# Patient Record
Sex: Female | Born: 2000 | Race: White | Hispanic: No | Marital: Single | Smoking: Never smoker
Health system: Southern US, Community
[De-identification: ages and names within clinical notes are randomized; demographics above are authoritative.]

## PROBLEM LIST (undated history)

## (undated) DIAGNOSIS — T7840XA Allergy, unspecified, initial encounter: Secondary | ICD-10-CM

## (undated) DIAGNOSIS — J45909 Unspecified asthma, uncomplicated: Secondary | ICD-10-CM

## (undated) DIAGNOSIS — R51 Headache: Secondary | ICD-10-CM

## (undated) HISTORY — DX: Allergy, unspecified, initial encounter: T78.40XA

## (undated) HISTORY — DX: Unspecified asthma, uncomplicated: J45.909

## (undated) HISTORY — DX: Headache: R51

---
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
## 2018-01-27 ENCOUNTER — Other Ambulatory Visit: Payer: Self-pay

## 2018-01-27 ENCOUNTER — Ambulatory Visit: Payer: BLUE CROSS/BLUE SHIELD | Admitting: Physician Assistant

## 2018-01-27 ENCOUNTER — Encounter: Payer: Self-pay | Admitting: Physician Assistant

## 2018-01-27 VITALS — BP 108/62 | HR 94 | Temp 99.0°F | Resp 18 | Ht 65.07 in | Wt 106.8 lb

## 2018-01-27 DIAGNOSIS — J4530 Mild persistent asthma, uncomplicated: Secondary | ICD-10-CM | POA: Diagnosis not present

## 2018-01-27 DIAGNOSIS — N946 Dysmenorrhea, unspecified: Secondary | ICD-10-CM | POA: Diagnosis not present

## 2018-01-27 DIAGNOSIS — G43009 Migraine without aura, not intractable, without status migrainosus: Secondary | ICD-10-CM | POA: Diagnosis not present

## 2018-01-27 DIAGNOSIS — F411 Generalized anxiety disorder: Secondary | ICD-10-CM | POA: Diagnosis not present

## 2018-01-27 MED ORDER — ESCITALOPRAM OXALATE 10 MG PO TABS: 10.0000 mg | ORAL_TABLET | Freq: Every day | ORAL | 0 refills | Status: DC

## 2018-01-27 MED ORDER — ONDANSETRON 4 MG PO TBDP: ORAL_TABLET | ORAL | 5 refills | Status: AC

## 2018-01-27 MED ORDER — MONTELUKAST SODIUM 10 MG PO TABS: 10.0000 mg | ORAL_TABLET | Freq: Every day | ORAL | 3 refills | Status: AC

## 2018-01-27 NOTE — Progress Notes (Signed)
Jessica Harding  MRN: [id number] DOB: [birth year]  PCP: Morrell RiddleWeber, Fumi Guadron L, PA-C  Chief Complaint  Patient presents with  . Medication Refill    med follow up     Subjective:  Pt presents to clinic for medication refills and discussion of stress/anxiety.  Patient has always had high levels of stress and high expectations of herself.  Recently since starting college last year seems to be causing negative effects in her life.  Mother is worried that she may have anxiety and might need treatment.  She has never sought therapy and does not feel inclined to do something like that.  Does not like to drive or even being a passenger due to her nerves and stress.  Gets stressed with her grades at school.  She is frustrated because being anxious is exhausting to her.  She is worried her irritability is causing trouble with relationships with friends and family. She is doing some isolation because when she is around people she gets irritated and she does nto want to have those feelings.    History is obtained by patient.  [facility] 01/27/2018 01/27/2018 08/26/2017 01/30/2017 08/23/2016  Decreased Interest 2 0 0 0 0  Down, Depressed, Hopeless 1 0 0 0 0  PHQ - 2 Score 3 0 0 0 0  Altered sleeping 3 - - - 0  Tired, decreased energy 2 - - - 0  Change in appetite 2 - - - 0  Feeling bad or failure about yourself  3 - - - 0  Trouble concentrating 1 - - - 0  Moving slowly or fidgety/restless 0 - - - 0  Suicidal thoughts 0 - - - 0  PHQ-9 Score 14 - - - 0  Difficult doing work/chores Somewhat difficult - - - -   GAD 7 : Generalized Anxiety Score 01/27/2018  Nervous, Anxious, on Edge 2  Control/stop worrying 3  Worry too much - different things 3  Trouble relaxing 3  Restless 2  Easily annoyed or irritable 3  Afraid - awful might happen 3  Total GAD 7 Score 19  Anxiety Difficulty Very difficult    Review of Systems  Constitutional: Positive for appetite change (Decreased appetite in the last 2  weeks which has been about the same timeframe as her increased anxiety.).  Respiratory: Negative for shortness of breath.        Asthma well controlled  Neurological: Positive for headaches (Mild headaches but not currently having migraines).  Psychiatric/Behavioral: Positive for dysphoric mood (Mild seems to be related to her stress and anxiety) and sleep disturbance (Does not fall asleep or stay asleep well.  Worse prior to stressful event.). Negative for self-injury. The patient is nervous/anxious.     Patient Active Problem List   Diagnosis Date Noted  . Environmental allergies -doing okay with symptoms 09/10/2015  . Dysmenorrhea -controlled with continuous cycling birth control pills 09/10/2015  . Extrinsic asthma -currently not needing Advair only uses rare as needed albuterol. 06/09/2013    Current Outpatient Medications on File Prior to Visit  Medication Sig Dispense Refill  . Albuterol Sulfate (PROAIR RESPICLICK) 108 (90 Base) MCG/ACT AEPB Inhale 1-2 puffs into the lungs every 4 (four) hours as needed. 3 each 0  . cetirizine (ZYRTEC) 10 MG tablet Take 1 tablet (10 mg total) by mouth at bedtime. 90 tablet 4  . methocarbamol (ROBAXIN) 750 MG tablet Take 1 tablet (750 mg total) by mouth 3 (three) times daily as needed for muscle  spasms. 30 tablet 0  . norgestimate-ethinyl estradiol (SPRINTEC 28) 0.25-35 MG-MCG tablet 1 po qd for 12 weeks (skipping placebo weeks in between) for continuous cycling 112 tablet 4  . rizatriptan (MAXALT-MLT) 10 MG disintegrating tablet Take 1 tablet (10 mg total) by mouth as needed for migraine. May repeat in 2 hours if needed 12 tablet 1  . Fluticasone-Salmeterol (ADVAIR) 100-50 MCG/DOSE AEPB Inhale 1 puff into the lungs every 12 (twelve) hours. (Patient not taking: Reported on 01/27/2018) 180 each 3   No current facility-administered medications on file prior to visit.     No Known Allergies  Past Medical History:  Diagnosis Date  . Allergy   . Asthma    . Headache(784.0)    Social History   Social History Narrative   Lives one sister, [place] to [place].    [employer] [employer]      Social History   Tobacco Use  . Smoking status: Never Smoker  . Smokeless tobacco: Never Used  Substance Use Topics  . Alcohol use: No    Alcohol/week: 0.0 standard drinks  . Drug use: No   family history includes Asthma in her maternal grandmother; Diabetes in her maternal grandfather, maternal grandmother, and paternal grandmother; Heart disease in her maternal grandmother; Hyperlipidemia in her maternal grandmother.     Objective:  BP 108/62   Pulse 94   Temp 99 F (37.2 C) (Oral)   Resp 18   Ht 5' 5.07" (1.653 m)   Wt 106 lb 12.8 oz (48.4 kg)   LMP 10/27/2017   SpO2 99%   BMI 17.73 kg/m  Body mass index is 17.73 kg/m.  Wt Readings from Last 3 Encounters:  01/27/18 106 lb 12.8 oz (48.4 kg) (11 %, Z= -1.21)*  08/26/17 107 lb 12.8 oz (48.9 kg) (14 %, Z= -1.08)*  01/30/17 108 lb 9.6 oz (49.3 kg) (17 %, Z= -0.95)*   * Growth percentiles are based on CDC (Girls, 2-20 Years) data.    Physical Exam  Constitutional: She is oriented to person, place, and time.  HENT:  Head: Normocephalic and atraumatic.  Right Ear: Hearing and external ear normal.  Left Ear: Hearing and external ear normal.  Eyes: Conjunctivae are normal.  Neck: Normal range of motion.  Cardiovascular: Normal rate, regular rhythm and normal heart sounds.  No murmur heard. Pulmonary/Chest: Effort normal and breath sounds normal. She has no wheezes.  Neurological: She is alert and oriented to person, place, and time.  Skin: Skin is warm and dry.  Psychiatric: Judgment normal.  Vitals reviewed.   Assessment and Plan :  Dysmenorrhea -continue birth control pills continuous cycling   Extrinsic asthma, mild persistent, uncomplicated - Plan: montelukast (SINGULAIR) 10 MG tablet -continue medications  Migraine without aura and without status migrainosus,  not intractable - Plan: ondansetron (ZOFRAN-ODT) 4 MG disintegrating tablet -currently controlled and has medicines if needed  Anxiety state - Plan: escitalopram (LEXAPRO) 10 MG tablet -patient has had long-standing anxiety that she is already was referred to is being a high strong stressful person but recently has started negatively affecting her life.  We will start Lexapro titrating up from 5 mg to 10 mg over the first week to 10 days based on side effects.  How to take the medicine, side effects possible and what to expect from the medicine was discussed with patient today.  She will follow-up with me in 6 to 8 weeks based on her results in less we need to sooner.  Patient verbalized to me that they understand the following: diagnosis, what is being done for them, what to expect and what should be done at home.  Their questions have been answered.  See after visit summary for patient specific instructions.  Benny Lennert PA-C  [facility] at [facility] 01/27/2018 3:01 PM  Please note: Portions of this report may have been transcribed using dragon voice recognition software. Every effort was made to ensure accuracy; however, inadvertent computerized transcription errors may be present.

## 2018-01-27 NOTE — Patient Instructions (Addendum)
  I will contact you with your lab results within the next 2 weeks.  If you have not heard from us then please contact us. The fastest way to get your results is to register for My Chart.   IF you received an x-ray today, you will receive an invoice from [facility]. Please contact [facility] at [phone] with questions or concerns regarding your invoice.   IF you received labwork today, you will receive an invoice from [facility]. Please contact [facility] at [phone] with questions or concerns regarding your invoice.   Our billing staff will not be able to assist you with questions regarding bills from these companies.  You will be contacted with the lab results as soon as they are available. The fastest way to get your results is to activate your My Chart account. Instructions are located on the last page of this paperwork. If you have not heard from us regarding the results in 2 weeks, please contact this office.     

## 2018-02-20 ENCOUNTER — Other Ambulatory Visit: Payer: Self-pay | Admitting: Physician Assistant

## 2018-02-20 DIAGNOSIS — F411 Generalized anxiety disorder: Secondary | ICD-10-CM

## 2018-03-01 ENCOUNTER — Other Ambulatory Visit: Payer: Self-pay | Admitting: Physician Assistant

## 2018-03-01 DIAGNOSIS — F411 Generalized anxiety disorder: Secondary | ICD-10-CM

## 2018-03-01 MED ORDER — ESCITALOPRAM OXALATE 10 MG PO TABS: 10.0000 mg | ORAL_TABLET | Freq: Every day | ORAL | 0 refills | Status: AC
